# Patient Record
Sex: Male | Born: 1955 | Race: White | Hispanic: No | Marital: Married | Smoking: Former smoker
Health system: Southern US, Community
[De-identification: ages and names within clinical notes are randomized; demographics above are authoritative.]

## PROBLEM LIST (undated history)

## (undated) DIAGNOSIS — C801 Malignant (primary) neoplasm, unspecified: Secondary | ICD-10-CM

## (undated) HISTORY — PX: JOINT REPLACEMENT: SHX530

## (undated) HISTORY — PX: HERNIA REPAIR: SHX51

## (undated) HISTORY — PX: CHOLECYSTECTOMY: SHX55

## (undated) HISTORY — PX: ANKLE SURGERY: SHX546

## (undated) HISTORY — DX: Malignant (primary) neoplasm, unspecified: C80.1

## (undated) HISTORY — PX: ABDOMINAL SURGERY: SHX537

## (undated) HISTORY — PX: COLON SURGERY: SHX602

## (undated) HISTORY — PX: OTHER SURGICAL HISTORY: SHX169

## (undated) HISTORY — PX: SHOULDER SURGERY: SHX246

---

## 2014-03-17 ENCOUNTER — Ambulatory Visit: Payer: Self-pay | Admitting: Physical Medicine & Rehabilitation

## 2014-03-24 ENCOUNTER — Encounter: Payer: Managed Care, Other (non HMO) | Attending: Physical Medicine & Rehabilitation

## 2014-03-24 ENCOUNTER — Encounter: Payer: Self-pay | Admitting: Physical Medicine & Rehabilitation

## 2014-03-24 ENCOUNTER — Other Ambulatory Visit: Payer: Self-pay | Admitting: Physical Medicine & Rehabilitation

## 2014-03-24 ENCOUNTER — Ambulatory Visit (HOSPITAL_BASED_OUTPATIENT_CLINIC_OR_DEPARTMENT_OTHER): Payer: Managed Care, Other (non HMO) | Admitting: Physical Medicine & Rehabilitation

## 2014-03-24 VITALS — BP 155/103 | HR 105 | Resp 14

## 2014-03-24 DIAGNOSIS — M533 Sacrococcygeal disorders, not elsewhere classified: Secondary | ICD-10-CM

## 2014-03-24 DIAGNOSIS — G894 Chronic pain syndrome: Secondary | ICD-10-CM | POA: Insufficient documentation

## 2014-03-24 DIAGNOSIS — M5136 Other intervertebral disc degeneration, lumbar region: Secondary | ICD-10-CM

## 2014-03-24 DIAGNOSIS — Z79899 Other long term (current) drug therapy: Secondary | ICD-10-CM

## 2014-03-24 DIAGNOSIS — M47816 Spondylosis without myelopathy or radiculopathy, lumbar region: Secondary | ICD-10-CM | POA: Insufficient documentation

## 2014-03-24 DIAGNOSIS — Z5181 Encounter for therapeutic drug level monitoring: Secondary | ICD-10-CM

## 2014-03-24 NOTE — Patient Instructions (Signed)
Back Exercises These exercises may help you when beginning to rehabilitate your injury. Your symptoms may resolve with or without further involvement from your physician, physical therapist or athletic trainer. While completing these exercises, remember:   Restoring tissue flexibility helps normal motion to return to the joints. This allows healthier, less painful movement and activity.  An effective stretch should be held for at least 30 seconds.  A stretch should never be painful. You should only feel a gentle lengthening or release in the stretched tissue. STRETCH - Extension, Prone on Elbows   Lie on your stomach on the floor, a bed will be too soft. Place your palms about shoulder width apart and at the height of your head.  Place your elbows under your shoulders. If this is too painful, stack pillows under your chest.  Allow your body to relax so that your hips drop lower and make contact more completely with the floor.  Hold this position for __________ seconds.  Slowly return to lying flat on the floor. Repeat __________ times. Complete this exercise __________ times per day.  RANGE OF MOTION - Extension, Prone Press Ups   Lie on your stomach on the floor, a bed will be too soft. Place your palms about shoulder width apart and at the height of your head.  Keeping your back as relaxed as possible, slowly straighten your elbows while keeping your hips on the floor. You may adjust the placement of your hands to maximize your comfort. As you gain motion, your hands will come more underneath your shoulders.  Hold this position __________ seconds.  Slowly return to lying flat on the floor. Repeat __________ times. Complete this exercise __________ times per day.  RANGE OF MOTION- Quadruped, Neutral Spine   Assume a hands and knees position on a firm surface. Keep your hands under your shoulders and your knees under your hips. You may place padding under your knees for  comfort.  Drop your head and point your tail bone toward the ground below you. This will round out your low back like an angry cat. Hold this position for __________ seconds.  Slowly lift your head and release your tail bone so that your back sags into a large arch, like an old horse.  Hold this position for __________ seconds.  Repeat this until you feel limber in your low back.  Now, find your "sweet spot." This will be the most comfortable position somewhere between the two previous positions. This is your neutral spine. Once you have found this position, tense your stomach muscles to support your low back.  Hold this position for __________ seconds. Repeat __________ times. Complete this exercise __________ times per day.  STRETCH - Flexion, Single Knee to Chest   Lie on a firm bed or floor with both legs extended in front of you.  Keeping one leg in contact with the floor, bring your opposite knee to your chest. Hold your leg in place by either grabbing behind your thigh or at your knee.  Pull until you feel a gentle stretch in your low back. Hold __________ seconds.  Slowly release your grasp and repeat the exercise with the opposite side. Repeat __________ times. Complete this exercise __________ times per day.  STRETCH - Hamstrings, Standing  Stand or sit and extend your right / left leg, placing your foot on a chair or foot stool  Keeping a slight arch in your low back and your hips straight forward.  Lead with your chest and   lean forward at the waist until you feel a gentle stretch in the back of your right / left knee or thigh. (When done correctly, this exercise requires leaning only a small distance.)  Hold this position for __________ seconds. Repeat __________ times. Complete this stretch __________ times per day. STRENGTHENING - Deep Abdominals, Pelvic Tilt   Lie on a firm bed or floor. Keeping your legs in front of you, bend your knees so they are both pointed  toward the ceiling and your feet are flat on the floor.  Tense your lower abdominal muscles to press your low back into the floor. This motion will rotate your pelvis so that your tail bone is scooping upwards rather than pointing at your feet or into the floor.  With a gentle tension and even breathing, hold this position for __________ seconds. Repeat __________ times. Complete this exercise __________ times per day.  STRENGTHENING - Abdominals, Crunches   Lie on a firm bed or floor. Keeping your legs in front of you, bend your knees so they are both pointed toward the ceiling and your feet are flat on the floor. Cross your arms over your chest.  Slightly tip your chin down without bending your neck.  Tense your abdominals and slowly lift your trunk high enough to just clear your shoulder blades. Lifting higher can put excessive stress on the low back and does not further strengthen your abdominal muscles.  Control your return to the starting position. Repeat __________ times. Complete this exercise __________ times per day.  STRENGTHENING - Quadruped, Opposite UE/LE Lift   Assume a hands and knees position on a firm surface. Keep your hands under your shoulders and your knees under your hips. You may place padding under your knees for comfort.  Find your neutral spine and gently tense your abdominal muscles so that you can maintain this position. Your shoulders and hips should form a rectangle that is parallel with the floor and is not twisted.  Keeping your trunk steady, lift your right hand no higher than your shoulder and then your left leg no higher than your hip. Make sure you are not holding your breath. Hold this position __________ seconds.  Continuing to keep your abdominal muscles tense and your back steady, slowly return to your starting position. Repeat with the opposite arm and leg. Repeat __________ times. Complete this exercise __________ times per day. Document Released:  02/28/2005 Document Revised: 05/05/2011 Document Reviewed: 05/25/2008 [employer] Patient Information 2015 [employer]. This information is not intended to replace advice given to you by your health care provider. Make sure you discuss any questions you have with your health care provider.  

## 2014-03-24 NOTE — Progress Notes (Signed)
Chief complaint low back pain Consultation requested by Dr. Theadore Nan 59 year old male with long history of low back pain. He was initially diagnosed with L5-S1 disc herniation 2011. He has been treated in [place] but recently relocated to [place] in September 2015. Most recent MRI lumbar spine was on 07/12/2013. No abnormalities at L1-L3. At L3-4 symmetric bulging disc with left foraminal focal disc protrusion with mild facet arthropathy resulting in mild to moderate left neural foraminal narrowing. No change compared to prior study 08/17/2012. At L5-S1 broad-based disc protrusion annular fissure mild facet arthropathy mild foraminal stenosis unchanged compared to prior study. At L5-S1 small central disc protrusion annular fissure mild facet arthropathy. Patient was evaluated by spine surgeon not felt to require surgery. EMG performed 08/12/2012 demonstrated absent right tibial H reflex.  Patient continues to work full-time Patient was treated with oxycodone 10 mg 4 times a day in [place] and this has been continued by his primary care physician in [place]

## 2014-03-24 NOTE — Progress Notes (Signed)
Subjective:    Patient ID: Brian Townsend, male    DOB: 1955-09-05, 59 y.o.   MRN: [id number]  HPI Chief complaint low back pain Consultation requested by Dr. Theadore Nan 59 year old male with long history of low back pain. He was initially diagnosed with L5-S1 disc herniation 2011. He has been treated in [place] but recently relocated to [place] in September 2015. Most recent MRI lumbar spine was on 07/12/2013. No abnormalities at L1-L3. At L3-4 symmetric bulging disc with left foraminal focal disc protrusion with mild facet arthropathy resulting in mild to moderate left neural foraminal narrowing. No change compared to prior study 08/17/2012. At L5-S1 broad-based disc protrusion annular fissure mild facet arthropathy mild foraminal stenosis unchanged compared to prior study. At L5-S1 small central disc protrusion annular fissure mild facet arthropathy. Patient was evaluated by spine surgeon not felt to require surgery. EMG performed 08/12/2012 demonstrated absent right tibial H reflex. Was evaluated by pain management physician Dr Jenny Reichmann in [place] and underwent facet medial branch blocks L2-3 L4-L5 and then underwent lumbar radiofrequency which was last performed in 2014. Patient continues to work full-time Patient was treated with oxycodone 10 mg 4 times a day in [place] and this has been continued by his primary care physician in [place]  Intermittent stabbing pain  Pain Inventory Average Pain 6 Pain Right Now 7 My pain is sharp, burning, stabbing, tingling and aching  In the last 24 hours, has pain interfered with the following? General activity 3 Relation with others 0 Enjoyment of life 0 What TIME of day is your pain at its worst? evening Sleep (in general) Fair  Pain is worse with: walking, bending and inactivity Pain improves with: medication Relief from Meds: 7  Mobility walk without assistance how many minutes can you walk? 30 ability to climb steps?  yes do you  drive?  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 History   Social History  . Marital Status: Married    Spouse Name: N/A    Number of Children: N/A  . Years of Education: N/A   Social History Main Topics  . Smoking status: Former Smoker -- 5 years    Types: Cigarettes  . Smokeless tobacco: None     Comment: smoked from 59 yr old-21 and then quit  . Alcohol Use: None  . Drug Use: None  . Sexual Activity: None   Other Topics Concern  . None   Social History Narrative  . None   Past Surgical History  Procedure Laterality Date  . Joint replacement Right     knee replacement  . Colon surgery      resection and reversal  . Hernia repair    . Cholecystectomy    . Abdominal surgery      six times  . Ankle surgery Left   . Shoulder surgery      x2  . Meniscus tear      x2   Past Medical History  Diagnosis Date  . Cancer     colon-resection and reversal   BP 155/103 mmHg  Pulse 105  Resp 14  SpO2 96%  Opioid Risk Score: 3 Fall Risk Score: Low Fall Risk (0-5 points) (educated and given handout)  Review of Systems  Gastrointestinal: Positive for abdominal pain.  Musculoskeletal: Positive for back pain.  Spasms  Neurological: Positive for numbness.  All other systems reviewed and are negative.      Objective:   Physical Exam  Constitutional: He is oriented to person, place, and time. He appears well-developed and well-nourished.  HENT:  Head: Normocephalic and atraumatic.  Eyes: Conjunctivae and EOM are normal. Pupils are equal, round, and reactive to light.  Musculoskeletal:  Hip internal rotation reduced bilaterally. External rotation intact  Status post right total knee replacement  Neurological: He is alert and oriented to person, place, and time.  Reflex  Scores:      Patellar reflexes are 1+ on the right side and 2+ on the left side.      Achilles reflexes are 2+ on the right side and 2+ on the left side. Decreased sensation left L4 dermatomal distribution  Pedal and posterior tibial pulses intact.  Femoral stretch test positive on left but has bilateral quad tightness  Psychiatric: He has a normal mood and affect.  Nursing note and vitals reviewed. Lumbar flexion and extension lateral bending and rotation 25% Tenderness with light palpation bilateral L2-L3 L4-L5 paraspinal muscles. Negative straight leg raise Motor strength 4/5 bilateral hip flexors and knee extensors ankle dorsiflexors and plantar flexors      Assessment & Plan:  1. Lumbar spondylosis proximal 15 months post RFA  Which helped upper lumbar pain. Low lower pain was not affected by RFA or with medial branch blocks. Suspect sacroiliac mediated pain. Also could be discogenic in which case no injection would be helpful.  2.  Left L3-L4 radiculitis. 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 As explained to the patient epidural may help with thigh pain but shouldn't really do much for the back pain. Thigh pain is not a major complaint at the current time. If it becomes more problematic may add a medication such as Neurontin or Lyrica.  Chronic pain syndrome recommend stretching as he is limited in terms of core strengthening based on history of ventral hernia. Avoid lumbar extension, concentrated on lumbar flexion as well as hamstring stretching

## 2014-03-25 LAB — PMP ALCOHOL METABOLITE (ETG): ETGU: NEGATIVE ng/mL

## 2014-03-28 LAB — OPIATES/OPIOIDS (LC/MS-MS)
Codeine Urine: NEGATIVE ng/mL (ref <50)
Hydrocodone: NEGATIVE ng/mL (ref <50)
Hydromorphone: NEGATIVE ng/mL (ref <50)
MORPHINE: NEGATIVE ng/mL (ref <50)
Norhydrocodone, Ur: NEGATIVE ng/mL (ref <50)
Noroxycodone, Ur: 5972 ng/mL (ref <50)
OXYCODONE, UR: 3696 ng/mL (ref <50)
OXYMORPHONE, URINE: 5459 ng/mL (ref <50)

## 2014-03-28 LAB — OXYCODONE, URINE (LC/MS-MS)
Noroxycodone, Ur: 5972 ng/mL (ref <50)
OXYMORPHONE, URINE: 5459 ng/mL (ref <50)
Oxycodone, ur: 3696 ng/mL (ref <50)

## 2014-03-29 LAB — PRESCRIPTION MONITORING PROFILE (SOLSTAS)
Amphetamine/Meth: NEGATIVE ng/mL
Barbiturate Screen, Urine: NEGATIVE ng/mL
Benzodiazepine Screen, Urine: NEGATIVE ng/mL
Buprenorphine, Urine: NEGATIVE ng/mL
CANNABINOID SCRN UR: NEGATIVE ng/mL
COCAINE METABOLITES: NEGATIVE ng/mL
Carisoprodol, Urine: NEGATIVE ng/mL
Creatinine, Urine: 138.59 mg/dL (ref >20.0)
Fentanyl, Ur: NEGATIVE ng/mL
MDMA URINE: NEGATIVE ng/mL
METHADONE SCREEN, URINE: NEGATIVE ng/mL
Meperidine, Ur: NEGATIVE ng/mL
Nitrites, Initial: NEGATIVE ug/mL
PROPOXYPHENE: NEGATIVE ng/mL
Tapentadol, urine: NEGATIVE ng/mL
Tramadol Scrn, Ur: NEGATIVE ng/mL
Zolpidem, Urine: NEGATIVE ng/mL
pH, Initial: 6 pH (ref 4.5–8.9)

## 2014-04-04 NOTE — Progress Notes (Signed)
Urine drug screen for this encounter is consistent for prescribed medication 

## 2014-04-07 ENCOUNTER — Ambulatory Visit (HOSPITAL_BASED_OUTPATIENT_CLINIC_OR_DEPARTMENT_OTHER): Payer: Managed Care, Other (non HMO) | Admitting: Physical Medicine & Rehabilitation

## 2014-04-07 ENCOUNTER — Encounter: Payer: Self-pay | Admitting: Physical Medicine & Rehabilitation

## 2014-04-07 ENCOUNTER — Encounter: Payer: Managed Care, Other (non HMO) | Attending: Physical Medicine & Rehabilitation

## 2014-04-07 VITALS — BP 147/93 | HR 90 | Resp 14

## 2014-04-07 DIAGNOSIS — G894 Chronic pain syndrome: Secondary | ICD-10-CM | POA: Insufficient documentation

## 2014-04-07 DIAGNOSIS — M47816 Spondylosis without myelopathy or radiculopathy, lumbar region: Secondary | ICD-10-CM | POA: Insufficient documentation

## 2014-04-07 DIAGNOSIS — M533 Sacrococcygeal disorders, not elsewhere classified: Secondary | ICD-10-CM | POA: Insufficient documentation

## 2014-04-07 DIAGNOSIS — Z5181 Encounter for therapeutic drug level monitoring: Secondary | ICD-10-CM | POA: Diagnosis present

## 2014-04-07 DIAGNOSIS — M5136 Other intervertebral disc degeneration, lumbar region: Secondary | ICD-10-CM | POA: Diagnosis not present

## 2014-04-07 MED ORDER — OXYCODONE-ACETAMINOPHEN 10-325 MG PO TABS: 1.0000 | ORAL_TABLET | Freq: Four times a day (QID) | ORAL | Status: DC | PRN

## 2014-04-07 NOTE — Progress Notes (Signed)
Bilateral sacroiliac injections under fluoroscopic guidance  Indication: Low back and buttocks pain not relieved by medication management and other conservative care.  Informed consent was obtained after describing risks and benefits of the procedure with the patient, this includes bleeding, bruising, infection, paralysis and medication side effects. The patient wishes to proceed and has given written consent. The patient was placed in a prone position. The lumbar and sacral area was marked and prepped with Betadine. A 25-gauge 1-1/2 inch needle was inserted into the skin and subcutaneous tissue and 1 mL of 1% lidocaine was injected into each side. Then a 25-gauge 3 inch spinal needle was inserted under fluoroscopic guidance into the left sacroiliac joint. AP and lateral images were utilized. Omnipaque 180x0.5 mL under live fluoroscopy demonstrated no intravascular uptake. Then a solution containing one ML of 6 mg per mL Celestone in 2 ML of 2% lidocaine MPF was injected x1.5 mL. This same procedure was repeated on the right side using the same needle, injectate, and technique. Patient tolerated the procedure well. Post procedure instructions were given. Please see post procedure form.  Viewed urine drug screen results which were consistent We'll start oxycodone 10 mg 4 times per day Repeat injection 1 month depending on result

## 2014-04-07 NOTE — Patient Instructions (Signed)
Sacroiliac injection was performed today. A combination of a naming medicine plus a cortisone medicine was injected. The injection was done under x-ray guidance. This procedure has been performed to help reduce low back and buttocks pain as well as potentially hip pain. The duration of this injection is variable lasting from hours to  Months. It may repeated if needed. 

## 2014-04-07 NOTE — Progress Notes (Signed)
  PROCEDURE RECORD [facility]   Name: Brian Townsend DOB:January 31, 1956 MRN: [id number]  Date:04/07/2014  Physician: Alysia Penna, MD    Nurse/CMA: Mancel Parsons, CMA  Allergies:  Allergies  Allergen Reactions  . Morphine And Related Other (See Comments)    Nausea and hiccups    Consent Signed: Yes.    Is patient diabetic? No.  CBG today?   Pregnant: No. LMP: No LMP for male patient. (age 59-55)  Anticoagulants: no Anti-inflammatory: no Antibiotics: no  Procedure: Bilateral Sacroiliac Steroid Injection Position: Prone Start Time: 9:47 am  End Time: 9:52 am  Fluoro Time: 16  RN/CMA Rolan Bucco October Peery    Time 9:10 am 9:56 am    BP 147/93 168/95    Pulse 90 83    Respirations 14 14    O2 Sat 96 99    S/S 6 6 am    Pain Level 7/10 5/10     D/C home with Taxi, patient A & O X 3, D/C instructions reviewed, and sits independently.

## 2014-05-05 ENCOUNTER — Ambulatory Visit: Payer: Managed Care, Other (non HMO) | Admitting: Physical Medicine & Rehabilitation

## 2014-05-18 ENCOUNTER — Ambulatory Visit: Payer: Self-pay | Admitting: Physical Medicine & Rehabilitation

## 2014-05-18 DIAGNOSIS — G894 Chronic pain syndrome: Secondary | ICD-10-CM | POA: Insufficient documentation

## 2014-05-18 DIAGNOSIS — Z5181 Encounter for therapeutic drug level monitoring: Secondary | ICD-10-CM | POA: Insufficient documentation

## 2014-05-18 DIAGNOSIS — M5136 Other intervertebral disc degeneration, lumbar region: Secondary | ICD-10-CM | POA: Insufficient documentation

## 2014-05-18 DIAGNOSIS — M533 Sacrococcygeal disorders, not elsewhere classified: Secondary | ICD-10-CM | POA: Insufficient documentation

## 2014-05-18 DIAGNOSIS — M47816 Spondylosis without myelopathy or radiculopathy, lumbar region: Secondary | ICD-10-CM | POA: Insufficient documentation

## 2014-05-22 ENCOUNTER — Encounter (HOSPITAL_COMMUNITY): Payer: Self-pay | Admitting: Emergency Medicine

## 2014-05-22 ENCOUNTER — Emergency Department (HOSPITAL_COMMUNITY)
Admission: EM | Admit: 2014-05-22 | Discharge: 2014-05-22 | Disposition: A | Discharge status: Home / Self Care | Payer: Managed Care, Other (non HMO) | Attending: Emergency Medicine | Admitting: Emergency Medicine

## 2014-05-22 ENCOUNTER — Emergency Department (HOSPITAL_COMMUNITY): Payer: Managed Care, Other (non HMO)

## 2014-05-22 DIAGNOSIS — F419 Anxiety disorder, unspecified: Secondary | ICD-10-CM | POA: Insufficient documentation

## 2014-05-22 DIAGNOSIS — Z791 Long term (current) use of non-steroidal anti-inflammatories (NSAID): Secondary | ICD-10-CM | POA: Insufficient documentation

## 2014-05-22 DIAGNOSIS — Z87891 Personal history of nicotine dependence: Secondary | ICD-10-CM | POA: Insufficient documentation

## 2014-05-22 DIAGNOSIS — Z85038 Personal history of other malignant neoplasm of large intestine: Secondary | ICD-10-CM | POA: Insufficient documentation

## 2014-05-22 DIAGNOSIS — Z79899 Other long term (current) drug therapy: Secondary | ICD-10-CM | POA: Insufficient documentation

## 2014-05-22 DIAGNOSIS — R0602 Shortness of breath: Secondary | ICD-10-CM | POA: Insufficient documentation

## 2014-05-22 LAB — I-STAT TROPONIN, ED
Troponin i, poc: 0 ng/mL (ref 0.00–0.08)
Troponin i, poc: 0 ng/mL (ref 0.00–0.08)

## 2014-05-22 LAB — CBC WITH DIFFERENTIAL/PLATELET
BASOS PCT: 0 % (ref 0–1)
Basophils Absolute: 0 10*3/uL (ref 0.0–0.1)
Eosinophils Absolute: 0.1 10*3/uL (ref 0.0–0.7)
Eosinophils Relative: 2 % (ref 0–5)
HCT: 40.6 % (ref 39.0–52.0)
Hemoglobin: 14.8 g/dL (ref 13.0–17.0)
LYMPHS ABS: 1.8 10*3/uL (ref 0.7–4.0)
Lymphocytes Relative: 27 % (ref 12–46)
MCH: 30 pg (ref 26.0–34.0)
MCHC: 36.5 g/dL — AB (ref 30.0–36.0)
MCV: 82.2 fL (ref 78.0–100.0)
MONOS PCT: 6 % (ref 3–12)
Monocytes Absolute: 0.4 10*3/uL (ref 0.1–1.0)
NEUTROS ABS: 4.4 10*3/uL (ref 1.7–7.7)
NEUTROS PCT: 65 % (ref 43–77)
PLATELETS: 254 10*3/uL (ref 150–400)
RBC: 4.94 MIL/uL (ref 4.22–5.81)
RDW: 12.2 % (ref 11.5–15.5)
WBC: 6.7 10*3/uL (ref 4.0–10.5)

## 2014-05-22 LAB — BASIC METABOLIC PANEL
Anion gap: 12 (ref 5–15)
BUN: 13 mg/dL (ref 6–23)
CO2: 23 mmol/L (ref 19–32)
Calcium: 9.2 mg/dL (ref 8.4–10.5)
Chloride: 100 mmol/L (ref 96–112)
Creatinine, Ser: 0.84 mg/dL (ref 0.50–1.35)
GLUCOSE: 107 mg/dL — AB (ref 70–99)
POTASSIUM: 3.5 mmol/L (ref 3.5–5.1)
Sodium: 135 mmol/L (ref 135–145)

## 2014-05-22 LAB — BRAIN NATRIURETIC PEPTIDE: B NATRIURETIC PEPTIDE 5: 8.3 pg/mL (ref 0.0–100.0)

## 2014-05-22 LAB — D-DIMER, QUANTITATIVE: D-Dimer, Quant: 0.28 ug/mL-FEU (ref 0.00–0.48)

## 2014-05-22 MED ORDER — SODIUM CHLORIDE 0.9 % IV BOLUS (SEPSIS)
1000.0000 mL | Freq: Once | INTRAVENOUS | Status: AC
  Administered 2014-05-22: 1000 mL via INTRAVENOUS

## 2014-05-22 MED ORDER — LORAZEPAM 2 MG/ML IJ SOLN
0.5000 mg | Freq: Once | INTRAMUSCULAR | Status: AC
  Administered 2014-05-22: 0.5 mg via INTRAVENOUS
  Filled 2014-05-22: qty 1

## 2014-05-22 MED ORDER — AZITHROMYCIN 250 MG PO TABS: 1000.0000 mg | ORAL_TABLET | Freq: Once | ORAL | Status: DC

## 2014-05-22 MED ORDER — CEFTRIAXONE SODIUM 250 MG IJ SOLR: 250.0000 mg | INTRAMUSCULAR | Status: DC

## 2014-05-22 NOTE — ED Provider Notes (Signed)
CSN: [id number]     Arrival date & time 05/22/14  0453 History   First MD Initiated Contact with Patient 05/22/14 [phone]     Chief Complaint  Patient presents with  . Shortness of Breath    HPI   Patient seen by Antonietta Breach PA signed out to me at shift change.  59 year old male presents today with acute episode of shortness of breath while sleeping. Patient reports he woke up feeling as if he could not catch his breath lasting approximately one hour. He reports similar episodes like this previously but not lasting as long. He denies associated chest pain, cough, nausea, vomiting, headache, abdominal pain, lower extremity swelling. No history of ACS for him or his family. History diabetes. Currently seen at [facility]. No additional complaints  Past Medical History  Diagnosis Date  . Cancer     colon-resection and reversal   Past Surgical History  Procedure Laterality Date  . Joint replacement Right     knee replacement  . Colon surgery      resection and reversal  . Hernia repair    . Cholecystectomy    . Abdominal surgery      six times  . Ankle surgery Left   . Shoulder surgery      x2  . Meniscus tear      x2   History reviewed. No pertinent family history. History  Substance Use Topics  . Smoking status: Former Smoker -- 5 years    Types: Cigarettes  . Smokeless tobacco: Former Systems developer     Comment: smoked from 59 yr old-21 and then quit  . Alcohol Use: 0.0 oz/week    0 Standard drinks or equivalent per week     Comment: occ,    Review of Systems  All other systems reviewed and are negative.  Allergies  Morphine and related  Home Medications   Prior to Admission medications   Medication Sig Start Date End Date Taking? Authorizing Provider  cyclobenzaprine (FLEXERIL) 10 MG tablet Take 10 mg by mouth at bedtime.   Yes Cari Caraway, MD  DM-Doxylamine-Acetaminophen (NYQUIL COLD & FLU PO) Take 15 mLs by mouth at bedtime as needed (cough).   Yes Historical  Provider, MD  Ibuprofen-Diphenhydramine HCl 200-25 MG CAPS Take 2 tablets by mouth at bedtime as needed (sleep).   Yes Historical Provider, MD  naproxen (NAPROSYN) 500 MG tablet Take 500 mg by mouth 2 (two) times daily with a meal.   Yes Cari Caraway, MD  neomycin-bacitracin-polymyxin (NEOSPORIN) ointment Apply 1 application topically 3 (three) times daily as needed for wound care.   Yes Historical Provider, MD  oxyCODONE-acetaminophen (PERCOCET) 10-325 MG per tablet Take 1 tablet by mouth every 6 (six) hours as needed for pain. 04/07/14  Yes Charlett Blake, MD   BP 155/101 mmHg  Pulse 91  Temp(Src) 98 F (36.7 C) (Oral)  Resp 18  Ht 6' (1.829 m)  Wt 215 lb (97.523 kg)  BMI 29.15 kg/m2  SpO2 99% Physical Exam  Constitutional: He is oriented to person, place, and time. He appears well-developed and well-nourished.  HENT:  Head: Normocephalic and atraumatic.  Eyes: Pupils are equal, round, and reactive to light.  Neck: Normal range of motion. Neck supple. No JVD present. No tracheal deviation present. No thyromegaly present.  Cardiovascular: Normal rate, regular rhythm, normal heart sounds and intact distal pulses.  Exam reveals no gallop and no friction rub.   No murmur heard. Pulmonary/Chest: Effort normal and breath sounds  normal. No stridor. No respiratory distress. He has no wheezes. He has no rales. He exhibits no tenderness.  Musculoskeletal: Normal range of motion.  Lymphadenopathy:    He has no cervical adenopathy.  Neurological: He is alert and oriented to person, place, and time. Coordination normal.  Skin: Skin is warm and dry.  Psychiatric: He has a normal mood and affect. His behavior is normal. Judgment and thought content normal.  Nursing note and vitals reviewed.   ED Course  Procedures (including critical care time) Labs Review Labs Reviewed  CBC WITH DIFFERENTIAL/PLATELET - Abnormal; Notable for the following:    MCHC 36.5 (*)    All other components within  normal limits  BASIC METABOLIC PANEL - Abnormal; Notable for the following:    Glucose, Bld 107 (*)    All other components within normal limits  BRAIN NATRIURETIC PEPTIDE  D-DIMER, QUANTITATIVE  I-STAT TROPOININ, ED  Randolm Idol, ED    Imaging Review Dg Chest 2 View  05/22/2014   CLINICAL DATA:  Worsening shortness of breath and difficulty breathing, acute onset. Initial encounter.  EXAM: CHEST  2 VIEW  COMPARISON:  None.  FINDINGS: The lungs are well-aerated and clear. There is no evidence of focal opacification, pleural effusion or pneumothorax. A likely left-sided nipple shadow is noted.  The heart is borderline normal in size. No acute osseous abnormalities are seen. Clips are noted within the right upper quadrant, reflecting prior cholecystectomy.  IMPRESSION: No acute cardiopulmonary process seen.   Electronically Signed   By: Garald Balding M.D.   On: 05/22/2014 05:40     EKG Interpretation   Date/Time:  Monday May 22 2014 05:13:48 EDT Ventricular Rate:  84 PR Interval:  144 QRS Duration: 93 QT Interval:  386 QTC Calculation: 456 R Axis:   -10 Text Interpretation:  Sinus rhythm Low voltage, precordial leads Confirmed  by YELVERTON  MD, DAVID ([id number]) on 05/22/2014 6:00:35 AM      MDM   Final diagnoses:  Shortness of breath   Labs:I-STAT troponin 2 negative d-dimer negative CBC BnP BMP no significant findings G chest   Imaging: DG chest no acute cardiopulmonary process seen  Consults: none   Therapeutics:ativan  Assessment/Plan: Patient's presentation likely associated with his anxiety, as he notes improvement with Ativan therapy. Labs and evaluation may cardiopulmonary origin unlikely. Patient is instructed to follow-up with his primary care provider for further evaluation and management. She understood and agreed with the plan. Given return instructions reportedly return if any new signs or symptoms presented.      Okey Regal, PA-C 05/22/14  1815  Julianne Rice, MD 06/03/14 [phone]

## 2014-05-22 NOTE — Discharge Instructions (Signed)
Shortness of Breath Shortness of breath means you have trouble breathing. Shortness of breath needs medical care right away. HOME CARE   Do not smoke.  Avoid being around chemicals or things (paint fumes, dust) that may bother your breathing.  Rest as needed. Slowly begin your normal activities.  Only take medicines as told by your doctor.  Keep all doctor visits as told. GET HELP RIGHT AWAY IF:   Your shortness of breath gets worse.  You feel lightheaded, pass out (faint), or have a cough that is not helped by medicine.  You cough up blood.  You have pain with breathing.  You have pain in your chest, arms, shoulders, or belly (abdomen).  You have a fever.  You cannot walk up stairs or exercise the way you normally do.  You do not get better in the time expected.  You have a hard time doing normal activities even with rest.  You have problems with your medicines.  You have any new symptoms. MAKE SURE YOU:  Understand these instructions.  Will watch your condition.  Will get help right away if you are not doing well or get worse. Document Released: 07/30/2007 Document Revised: 02/15/2013 Document Reviewed: 04/28/2011 [facility] Patient Information 2015 [place], [place]. This information is not intended to replace advice given to you by your health care provider. Make sure you discuss any questions you have with your health care provider.  Please contact her primary care provider to inform them of your visit today. Please sure all information with them please monitor for new or worsening signs or symptoms to return if any percent.

## 2014-05-22 NOTE — ED Provider Notes (Signed)
CSN: [id number]     Arrival date & time 05/22/14  0453 History   First MD Initiated Contact with Patient 05/22/14 [phone]     Chief Complaint  Patient presents with  . Shortness of Breath    (Consider location/radiation/quality/duration/timing/severity/associated sxs/prior Treatment) HPI Comments: Patient is a 59 year old male who presents to the emergency department for further evaluation of shortness of breath. Patient states that he awoke from sleep this evening feeling as though he was suffocating. Patient states that this sensation has been constant for approximately 1 hour. He reports 2 similar episodes in the past week, but states that these only lasted a few minutes. Patient reports that it feels as though his "lungs are burning". He states, "I feel like oxygen is in getting to my brain". Patient denies any chest pain associated with his symptoms. He further denies associated fever, syncope, hemoptysis, nausea or vomiting, abdominal pain, and leg swelling. Patient denies a personal and family history of ACS. He denies a history of diabetes as well as hypertension. He states that he relocated to [place] from [place] 6 months ago. He has been seen at [facility], but is in the process of transferring to [facility].  Patient is a 59 y.o. male presenting with shortness of breath. The history is provided by the patient. No language interpreter was used.  Shortness of Breath Associated symptoms: no abdominal pain, no chest pain, no cough, no fever and no vomiting     Past Medical History  Diagnosis Date  . Cancer     colon-resection and reversal   Past Surgical History  Procedure Laterality Date  . Joint replacement Right     knee replacement  . Colon surgery      resection and reversal  . Hernia repair    . Cholecystectomy    . Abdominal surgery      six times  . Ankle surgery Left   . Shoulder surgery      x2  . Meniscus tear      x2   History reviewed. No  pertinent family history. History  Substance Use Topics  . Smoking status: Former Smoker -- 5 years    Types: Cigarettes  . Smokeless tobacco: Former Systems developer     Comment: smoked from 59 yr old-21 and then quit  . Alcohol Use: 0.0 oz/week    0 Standard drinks or equivalent per week     Comment: occ,    Review of Systems  Constitutional: Negative for fever.  HENT: Positive for congestion.   Respiratory: Positive for shortness of breath. Negative for cough.   Cardiovascular: Negative for chest pain.  Gastrointestinal: Negative for nausea, vomiting and abdominal pain.  Neurological: Negative for syncope.  All other systems reviewed and are negative.   Allergies  Morphine and related  Home Medications   Prior to Admission medications   Medication Sig Start Date End Date Taking? Authorizing Provider  cyclobenzaprine (FLEXERIL) 10 MG tablet Take 10 mg by mouth at bedtime.   Yes Cari Caraway, MD  DM-Doxylamine-Acetaminophen (NYQUIL COLD & FLU PO) Take 15 mLs by mouth at bedtime as needed (cough).   Yes Historical Provider, MD  Ibuprofen-Diphenhydramine HCl 200-25 MG CAPS Take 2 tablets by mouth at bedtime as needed (sleep).   Yes Historical Provider, MD  naproxen (NAPROSYN) 500 MG tablet Take 500 mg by mouth 2 (two) times daily with a meal.   Yes Cari Caraway, MD  neomycin-bacitracin-polymyxin (NEOSPORIN) ointment Apply 1 application topically 3 (three)  times daily as needed for wound care.   Yes Historical Provider, MD  oxyCODONE-acetaminophen (PERCOCET) 10-325 MG per tablet Take 1 tablet by mouth every 6 (six) hours as needed for pain. 04/07/14  Yes Charlett Blake, MD   BP 160/109 mmHg  Pulse 98  Temp(Src) 98 F (36.7 C) (Oral)  Resp 22  Ht 6' (1.829 m)  Wt 215 lb (97.523 kg)  BMI 29.15 kg/m2  SpO2 100%   Physical Exam  Constitutional: He is oriented to person, place, and time. He appears well-developed and well-nourished. No distress.  Nontoxic/nonseptic appearing  HENT:   Head: Normocephalic and atraumatic.  Mouth/Throat: Oropharynx is clear and moist. No oropharyngeal exudate.  Eyes: Conjunctivae and EOM are normal. No scleral icterus.  Neck: Normal range of motion.  No JVD  Cardiovascular: Normal rate, regular rhythm and intact distal pulses.   No carotid bruits bilaterally  Pulmonary/Chest: Effort normal and breath sounds normal. No respiratory distress. He has no wheezes. He has no rales.  Chest expansion symmetric. Patient dyspneic without tachypnea. Lungs clear.  Abdominal: Soft. He exhibits no distension. There is no tenderness. There is no rebound.  Soft, nontender  Musculoskeletal: Normal range of motion.  Neurological: He is alert and oriented to person, place, and time. He exhibits normal muscle tone. Coordination normal.  GCS 15. Speech is goal oriented. Patient moves extremities without ataxia.  Skin: Skin is warm and dry. No rash noted. He is not diaphoretic. No erythema. No pallor.  Psychiatric: His behavior is normal. His mood appears anxious. His speech is rapid and/or pressured.  Nursing note and vitals reviewed.   ED Course  Procedures (including critical care time) Labs Review Labs Reviewed  CBC WITH DIFFERENTIAL/PLATELET  BRAIN NATRIURETIC PEPTIDE  BASIC METABOLIC PANEL  D-DIMER, QUANTITATIVE  I-STAT [place], ED    Imaging Review Dg Chest 2 View  05/22/2014   CLINICAL DATA:  Worsening shortness of breath and difficulty breathing, acute onset. Initial encounter.  EXAM: CHEST  2 VIEW  COMPARISON:  None.  FINDINGS: The lungs are well-aerated and clear. There is no evidence of focal opacification, pleural effusion or pneumothorax. A likely left-sided nipple shadow is noted.  The heart is borderline normal in size. No acute osseous abnormalities are seen. Clips are noted within the right upper quadrant, reflecting prior cholecystectomy.  IMPRESSION: No acute cardiopulmonary process seen.   Electronically Signed   By: Garald Balding  M.D.   On: 05/22/2014 05:40     EKG Interpretation   Date/Time:  Monday May 22 2014 05:13:48 EDT Ventricular Rate:  84 PR Interval:  144 QRS Duration: 93 QT Interval:  386 QTC Calculation: 456 R Axis:   -10 Text Interpretation:  Sinus rhythm Low voltage, precordial leads Confirmed  by Lita Mains  MD, DAVID ([id number]) on 05/22/2014 6:00:35 AM      MDM   Final diagnoses:  Shortness of breath    59 year old male presents to the emergency department for further evaluation of shortness of breath. He reports feeling as though he is being suffocated. This woke him up from sleep on 2 previous occasions as well as this evening. Symptoms tonight have been constant whereas prior symptoms only lasted a few minutes before spontaneously resolving.  Patient with no real complaints of chest pain. He has been given Ativan as he appears anxious and his symptoms may be secondary to anxiety. EKG is nonischemic and chest x-ray is unremarkable. His laboratory workup is pending, which includes troponin and d-dimer. Would also recommend  delta troponin at 0830/900. Patient signed out to Lenn Sink, PA-C at shift change who will follow and disposition appropriately.   Filed Vitals:   05/22/14 0456  BP: 160/109  Pulse: 98  Temp: 98 F (36.7 C)  TempSrc: Oral  Resp: 22  Height: 6' (1.829 m)  Weight: 215 lb (97.523 kg)  SpO2: 100%       Antonietta Breach, PA-C 05/22/14 0932  Julianne Rice, MD 06/03/14 [phone]

## 2014-05-22 NOTE — ED Notes (Addendum)
Pt reports that he awoke with ShOB this morning, stating "I feel like oxygen isn't getting to my brain." Pt noted to be hyperventilating. Education and encouragement provided to slow respirations. Pt reports "lungs burning" as only other complaint. Pt also states "I ran out of my Oxycodone 10/325mg  tablets and I have ten days before I can refill it. I have been running out 5-10 days early every month because my chronic back pain is getting worse."

## 2014-05-22 NOTE — ED Notes (Signed)
PA at bedside.

## 2014-05-24 ENCOUNTER — Telehealth: Payer: Self-pay | Admitting: *Deleted

## 2014-05-24 ENCOUNTER — Encounter: Payer: Self-pay | Admitting: Registered Nurse

## 2014-05-24 ENCOUNTER — Encounter: Payer: Managed Care, Other (non HMO) | Attending: Physical Medicine & Rehabilitation | Admitting: Registered Nurse

## 2014-05-24 VITALS — BP 149/96 | HR 100 | Resp 14

## 2014-05-24 DIAGNOSIS — G894 Chronic pain syndrome: Secondary | ICD-10-CM | POA: Diagnosis not present

## 2014-05-24 DIAGNOSIS — M533 Sacrococcygeal disorders, not elsewhere classified: Secondary | ICD-10-CM | POA: Diagnosis not present

## 2014-05-24 DIAGNOSIS — M5136 Other intervertebral disc degeneration, lumbar region: Secondary | ICD-10-CM | POA: Diagnosis not present

## 2014-05-24 DIAGNOSIS — M47816 Spondylosis without myelopathy or radiculopathy, lumbar region: Secondary | ICD-10-CM | POA: Diagnosis not present

## 2014-05-24 DIAGNOSIS — Z5181 Encounter for therapeutic drug level monitoring: Secondary | ICD-10-CM | POA: Diagnosis not present

## 2014-05-24 DIAGNOSIS — Z79899 Other long term (current) drug therapy: Secondary | ICD-10-CM

## 2014-05-24 MED ORDER — OXYCODONE-ACETAMINOPHEN 10-325 MG PO TABS: 1.0000 | ORAL_TABLET | Freq: Four times a day (QID) | ORAL | Status: DC | PRN

## 2014-05-24 MED ORDER — OXYCODONE-ACETAMINOPHEN 10-325 MG PO TABS: 1.0000 | ORAL_TABLET | Freq: Four times a day (QID) | ORAL | Status: AC | PRN

## 2014-05-24 NOTE — Progress Notes (Signed)
Subjective:    Patient ID: Brian Townsend, male    DOB: October 23, 1955, 59 y.o.   MRN: [id number]  HPI: Brian Townsend is a 59 year old male who returns for follow up for chronic pain and medication refill. He says his pain is located in his lower back radiating posteriorly into lower extremities. He rates his pain 7. His current exercise regime is walking and riding his bicycle 2-3 times a week for 30 minutes.  He forgot his medication bottle, he was educated on the Proofreader. Also informed he must have his bottle at next appointment he verbalizes understanding. He was given second script to accommodate Dr. Letta Pate appointment due to the time frame of appointments being a week apart. He verbalizes understanding.  Pain Inventory Average Pain 7 Pain Right Now 7 My pain is constant, sharp, stabbing and aching  In the last 24 hours, has pain interfered with the following? General activity 4 Relation with others 0 Enjoyment of life 2 What TIME of day is your pain at its worst? all Sleep (in general) Fair  Pain is worse with: walking, bending, sitting, inactivity, standing and some activites Pain improves with: medication Relief from Meds: 8  Mobility walk without assistance how many minutes can you walk? 15 ability to climb steps?  yes do you drive?  yes Do you have any goals in this area?  yes  Function employed # of hrs/week 40-50 I need assistance with the following:  household duties Do you have any goals in this area?  yes  Neuro/Psych weakness tingling spasms anxiety  Prior Studies Any changes since last visit?  no  Physicians involved in your care Any changes since last visit?  no   History reviewed. No pertinent family history. History   Social History  . Marital Status: Married    Spouse Name: N/A  . Number of Children: N/A  . Years of Education: N/A   Social History Main Topics  . Smoking status: Former Smoker -- 5 years    Types:  Cigarettes  . Smokeless tobacco: Former Systems developer     Comment: smoked from 59 yr old-21 and then quit  . Alcohol Use: 0.0 oz/week    0 Standard drinks or equivalent per week     Comment: occ,  . Drug Use: No  . Sexual Activity: Yes   Other Topics Concern  . None   Social History Narrative   Past Surgical History  Procedure Laterality Date  . Joint replacement Right     knee replacement  . Colon surgery      resection and reversal  . Hernia repair    . Cholecystectomy    . Abdominal surgery      six times  . Ankle surgery Left   . Shoulder surgery      x2  . Meniscus tear      x2   Past Medical History  Diagnosis Date  . Cancer     colon-resection and reversal   BP 149/96 mmHg  Pulse 100  Resp 14  SpO2 97%  Opioid Risk Score:   Fall Risk Score: Low Fall Risk (0-5 points)`1  Depression screen PHQ 2/9  Depression screen PHQ 2/9 05/24/2014  Decreased Interest 1  Down, Depressed, Hopeless 0  PHQ - 2 Score 1  Altered sleeping 1  Tired, decreased energy 0  Change in appetite 0  Feeling bad or failure about yourself  0  Trouble concentrating 0  Moving slowly or  fidgety/restless 1  Suicidal thoughts 0  PHQ-9 Score 3     Review of Systems  Respiratory: Positive for shortness of breath.   Neurological: Positive for weakness.       Tingling Spasms   Psychiatric/Behavioral: The patient is nervous/anxious.   All other systems reviewed and are negative.      Objective:   Physical Exam  Constitutional: He is oriented to person, place, and time. He appears well-developed and well-nourished.  HENT:  Head: Normocephalic and atraumatic.  Neck: Normal range of motion. Neck supple.  Cardiovascular: Normal rate and regular rhythm.   Pulmonary/Chest: Effort normal and breath sounds normal.  Musculoskeletal:  Normal Muscle Bulk and Muscle Testing Reveals: Upper Extremities: Full ROM and Muscle Strength 5/5 Thoracic Paraspinal Tenderness: T- 11- T-12 Lumbar  Paraspinal Tenderness: L-3- L-5 Lower Extremities: Full ROM and Muscle Strength 5/5 Right Lower Extremity Flexion Produces Pain into Patella Arises from chair with ease Narrow Based Gait   Neurological: He is alert and oriented to person, place, and time.  Skin: Skin is warm and dry.  Psychiatric: He has a normal mood and affect.  Nursing note and vitals reviewed.         Assessment & Plan:  1. 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 2. Sacroiliac joint pain: S/P Bilateral Sacroiliac Injection: Had three days of relief  3. Left L3-L4 radiculitis. 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 Continue to Monitor.  4. Chronic pain syndrome Continue with exercise regime and increase activity as tolerated.  20 minutes of face to face patient care time was spent during this visit. All questions was encouraged and answered.  F/U with Dr. Letta Pate

## 2014-05-24 NOTE — Telephone Encounter (Addendum)
Brian Townsend is calling back after visit asking about getting his naproxen and flexeril sent to pharmacy.  In reviewing his chart, I do not find that we have ever prescribed these medications at this time.  I noticed his ED visit 05/22/14. In his visit he admits to RN that he has been habitually running out of his medication 5-10 days early every month. The medication mentioned was percocet (generic) which we are prescribing. Brian Townsend has failed to bring his bottles to his visits so their was no verification available on what he was taking.  I checked [facility] to find last fill date and discovered that he has been filling a prescription from his [place] MD Dr Laurence Aly, 10 mg oxcodone #120 (04/13/14 and 05/08/14)  These prescriptions were written 03/02/14.  We gave him percocet 10/325 # on 04/07/14 written by Dr Letta Pate which does not show up on controlled substance report.  I verified these with his pharmacy.  He was here today and was given a refill for his oxycodone apap 10/325 by Zella Ball and an additional RX to get him to his next scheduled appt with Dr Letta Pate 06/30/14, since the date would be so close if we scheduled a one month April appt. Zella Ball did question his not having his bottle and if he should bring it back to office and was concerned about this). Ultimately, I discovered the situation of him having Rxs written by his [place] MD, yet taking Rxs from our providers. This coupled with his ED visit from possible panic attack and admitting that he takes more than prescribed running out early, set in motion a huge red flag on this gentleman. He called multiple times asking about is flexeril and naproxen which leads me to believe he is out of oxycodone and needing these meds to relieve pain until he can get another Rx filled which should be around 06/06/14 by the 30 day schedule since his last fill was 05/08/14.  I spoke with Brian Townsend in between discoveries and told him we should be able to refill these non narcotic  medications. After discovering the inconsistencies I recognized that this would not be appropriate until Dr Letta Pate could be consulted.  I reviewed his last (and only UDS being he was a new pt on 03/24/14) and found that it is true that it was consistent as reviewed--oxycodone and its metabolites were present, but he reported his last dose on 03/23/14 and testing was done on 03/24/14 and the amounts of this medication and metabolites were very large. Unfortunately we do not have a way of measuring if this was appropriate for dosing or was it a result of consuming more than prescribed.  After speaking with practice manager Anner Crete we decided to request all of the Rx's back from our office since he should have coverage through April 12, and require him to be seen to get the next RX for percocet if Dr Letta Pate continues narcotic treatment.  I spoke with Brian Townsend and his story was that we were aware that he had these prescriptions from his previous provider and he was holding on to our Rxs until he needed them. I informed him this was not our practice policy and that we do not issue medications while he has outstanding medication from another provider.  In researching his referral notes from Cari Caraway MD, there was a visit note included in referral packet. A note from 03/02/14 visit with Dr Ronnald Ramp showed he was giving him additional Rx's for oxycodone  10 mg #120 "do not fill until 04/11/14, "do not fill until 05/01/14" while he searched for pain management in [place]. Brian Townsend  insisted this was disclosed (in paperwork) but I assured him he did not disclose this verbally to me at first visit or to Dr Letta Pate because a prescription would not have been issued until the others had been exhausted or the understanding that the others were not to be filled. I requested all Rxs from our providers be returned and he agreed to to this but mentioned "even the one at the pharmacy?" and I said yes.  (I called the pharmacy after this  conversation and spoke with pharmacist named Maudie Mercury @ [facility] and confirmed he had turned in an Rx for percocet and it was the one from Dr Letta Pate dated 04/07/14-which they were holding to be filled. I requested that she destroy the Rx and she agreed to do so). I also informed Brian Townsend he would need to be seen to get the Rx for April by 06/04/14. He stated he could not do that date as he was traveling back to [place]. He called after this conversation and set up the appointment with Zella Ball for 06/05/14.  I spoke with Brian Townsend again and informed him that we could not fill the naproxen or cyclobenzaprine until Dr Letta Pate reviews this information on his return to the office 05/29/14 and that I had spoken with the pharmacist and had the Rx destroyed he took to them. He agreed to bring the other two Rxs for percocet he received at is appointment back on 05/25/14. We will wait for Dr Letta Pate to address when he returns to office.

## 2014-05-25 NOTE — Telephone Encounter (Addendum)
Brian Townsend brought back the two prescriptions that Brian Townsend gave him yesterday.  They will be destroyed.  I reviewed with him our rules of receiving narcotics and he claims he did not know he was to bring his bottle to every visit and could not have more than one prescription at a time as some MDs have given 3 months at a time in the other states.  I reiterated we do not do that here and we do not tolerate multiple prescribers.  Once Brian Townsend begins prescribing, he is to receive narcotics from no other prescriber including the ED, and he is to take AS PRESCRIBED and not more even though he may still experience pain.  I have given him a contract to bring back to his appointment. He asked why he ws not given one in the beginning. I explained that we do not give them the first visit because we do not prescribe on that date but he should have received one with the first prescription. He says that he is NOW aware of the rules and there will be no further problems. I told him OTC Aleve is naproxen and he can take 2 (220mg  ea) in place of the 500 mg rx dose he was taking until Brian Townsend returns.

## 2014-05-29 ENCOUNTER — Telehealth: Payer: Self-pay | Admitting: *Deleted

## 2014-05-29 MED ORDER — NAPROXEN 500 MG PO TABS: 500.0000 mg | ORAL_TABLET | Freq: Two times a day (BID) | ORAL | Status: AC

## 2014-05-29 MED ORDER — CYCLOBENZAPRINE HCL 10 MG PO TABS: 10.0000 mg | ORAL_TABLET | Freq: Every day | ORAL | Status: AC

## 2014-05-29 NOTE — Telephone Encounter (Signed)
So pt will either ned to turn in Rxs from other provider or fill them and finish Rx prior to getting another prescription from this office. He will also need UDS and office visit prior to any Rx from this office

## 2014-05-29 NOTE — Telephone Encounter (Signed)
He has used all the RX from Dr Ronnald Ramp, last filled 05/08/14  and is coming in to see Zella Ball for the April 11 refill.  He has no further outstanding RX.

## 2014-05-29 NOTE — Telephone Encounter (Signed)
Left message letting Mr Floren know that we have sent in orders for his flexeril and naproxen to his pharmacy.

## 2014-06-05 ENCOUNTER — Encounter: Payer: Managed Care, Other (non HMO) | Attending: Physical Medicine & Rehabilitation | Admitting: Registered Nurse

## 2014-06-05 DIAGNOSIS — M5136 Other intervertebral disc degeneration, lumbar region: Secondary | ICD-10-CM | POA: Insufficient documentation

## 2014-06-05 DIAGNOSIS — M47816 Spondylosis without myelopathy or radiculopathy, lumbar region: Secondary | ICD-10-CM | POA: Insufficient documentation

## 2014-06-05 DIAGNOSIS — Z5181 Encounter for therapeutic drug level monitoring: Secondary | ICD-10-CM | POA: Insufficient documentation

## 2014-06-05 DIAGNOSIS — G894 Chronic pain syndrome: Secondary | ICD-10-CM | POA: Insufficient documentation

## 2014-06-05 DIAGNOSIS — M533 Sacrococcygeal disorders, not elsewhere classified: Secondary | ICD-10-CM | POA: Insufficient documentation

## 2014-06-30 ENCOUNTER — Ambulatory Visit: Payer: Managed Care, Other (non HMO)

## 2014-06-30 ENCOUNTER — Ambulatory Visit: Payer: Managed Care, Other (non HMO) | Admitting: Physical Medicine & Rehabilitation

## 2015-05-30 ENCOUNTER — Encounter: Payer: Self-pay | Admitting: Physical Medicine & Rehabilitation

## 2016-02-05 IMAGING — CR DG CHEST 2V
2 series · 2 of 2 positions shown · non-contrast
Comparison: None.

CLINICAL DATA: Worsening shortness of breath and difficulty
breathing, acute onset. Initial encounter.

EXAM:
CHEST  2 VIEW

[w chest pa]
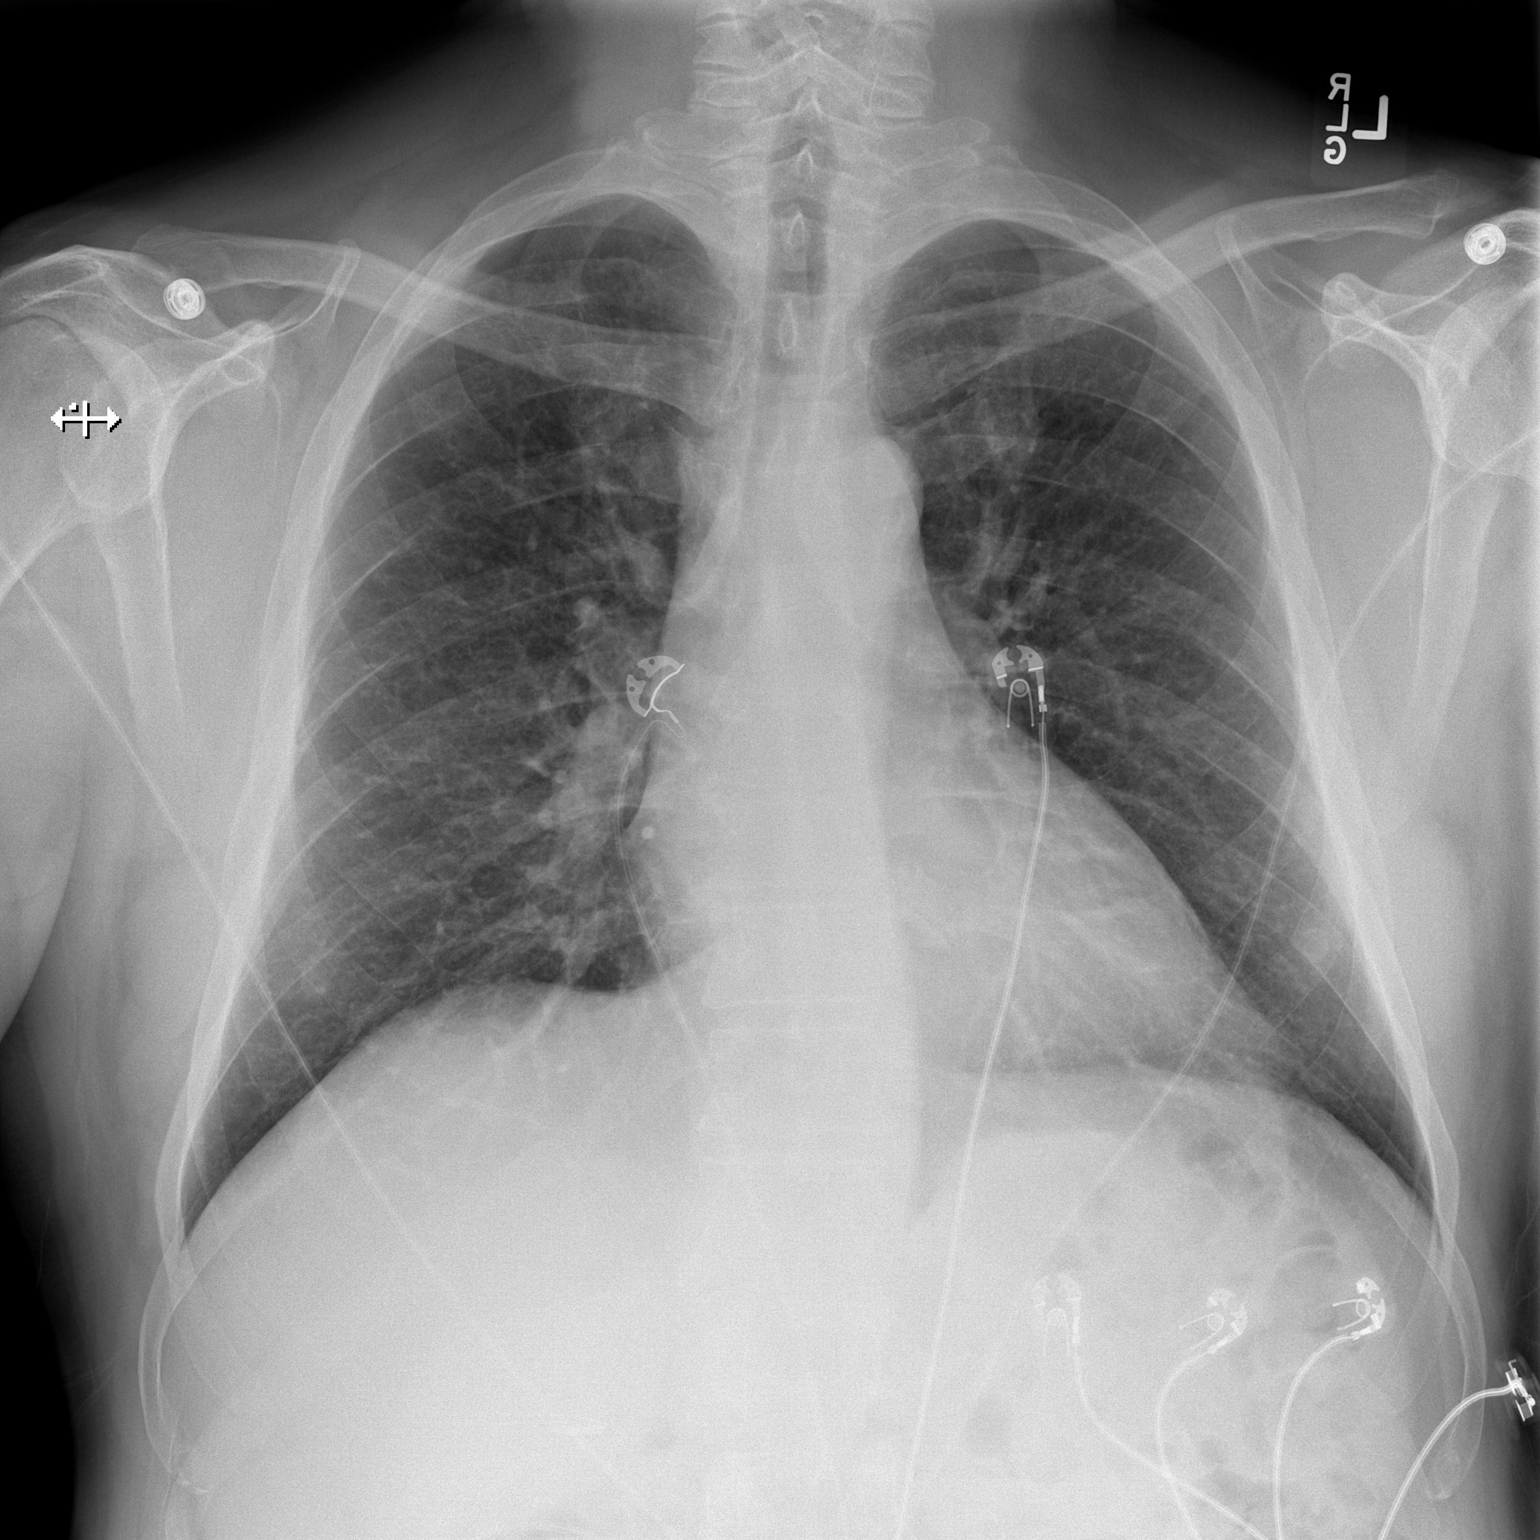

[w chest lat]
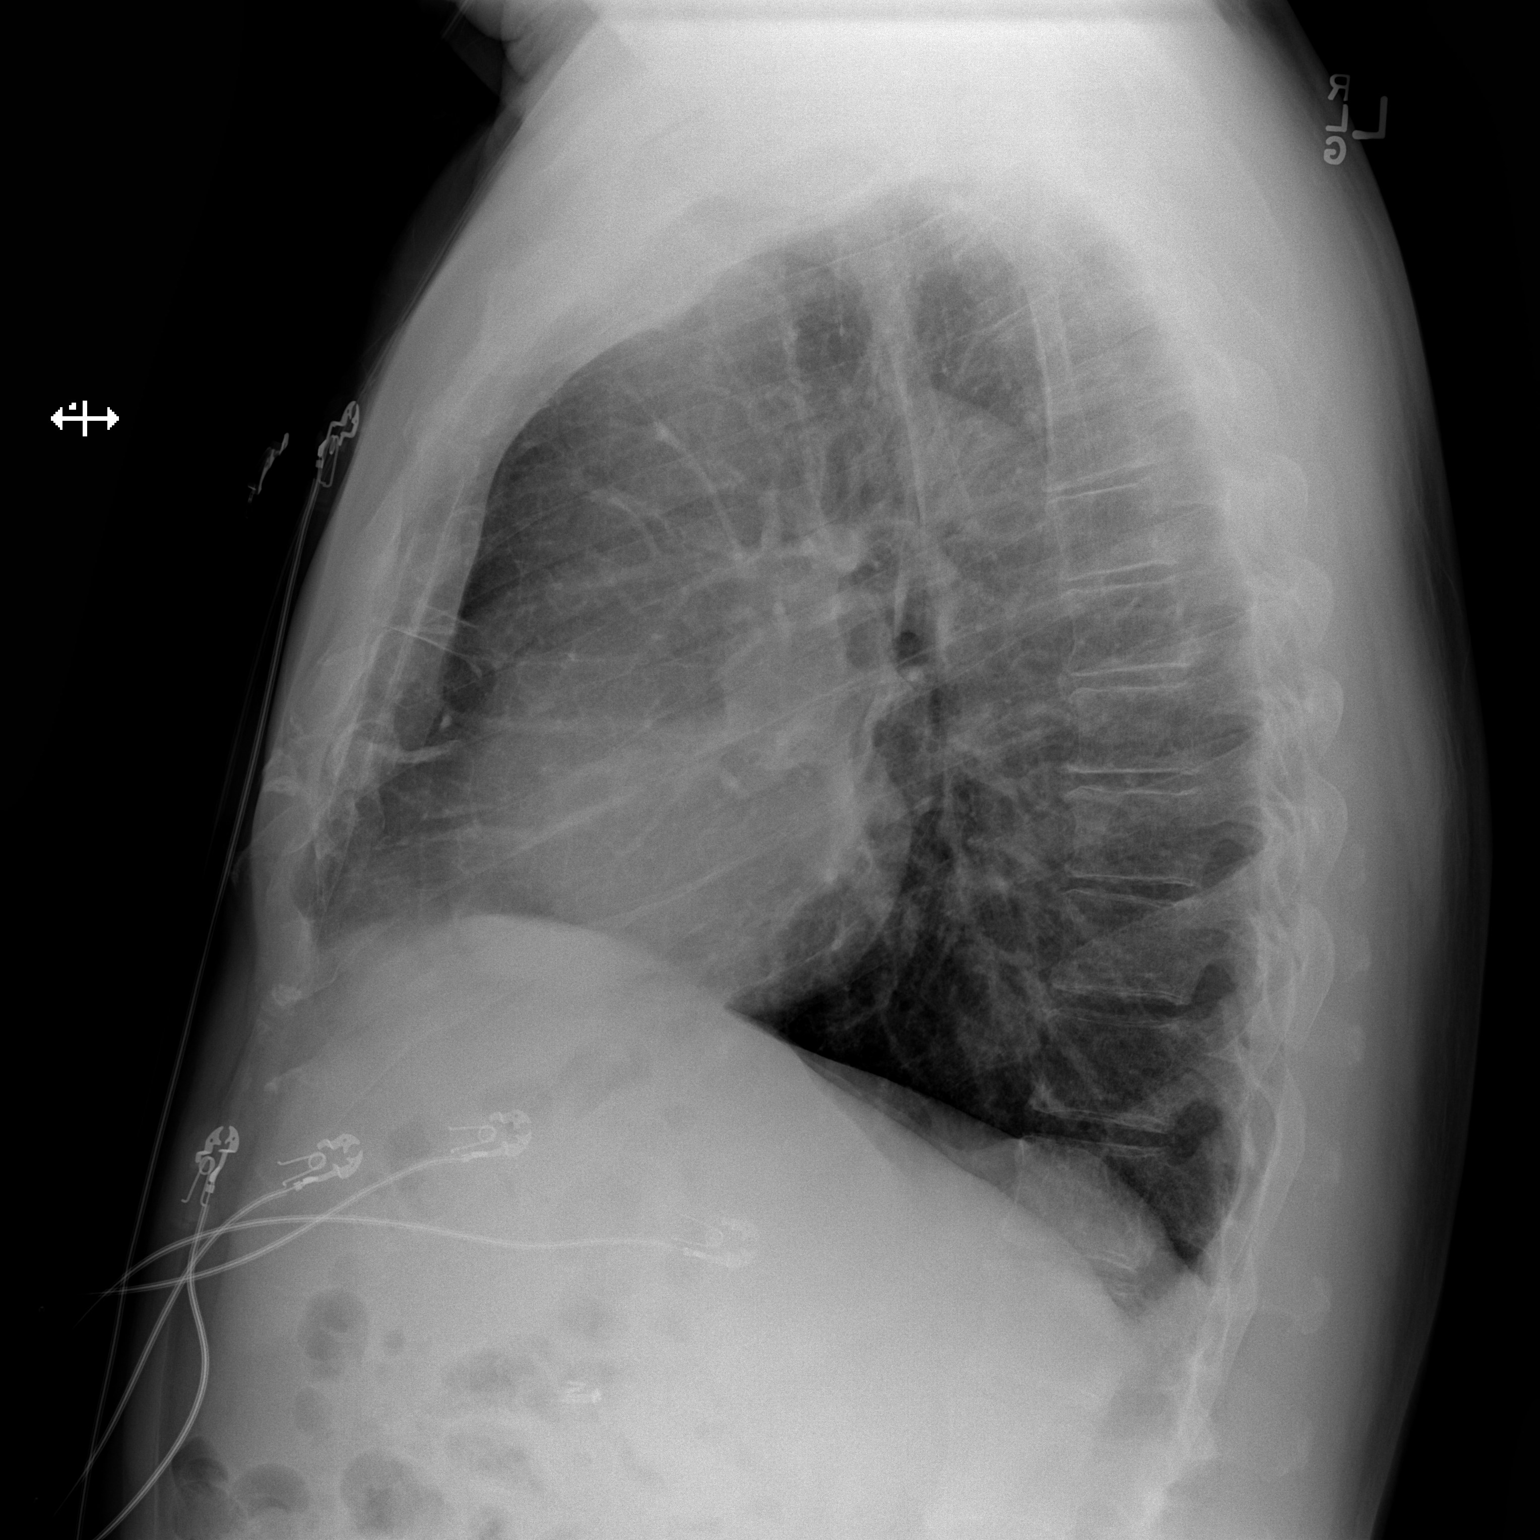

[2 of 2 positions shown; findings below may reference images not displayed]

FINDINGS: The lungs are well-aerated and clear. There is no evidence of focal
opacification, pleural effusion or pneumothorax. A likely left-sided
nipple shadow is noted.

The heart is borderline normal in size. No acute osseous
abnormalities are seen. Clips are noted within the right upper
quadrant, reflecting prior cholecystectomy.
IMPRESSION: No acute cardiopulmonary process seen.
